# Patient Record
Sex: Female | Born: 1985 | Race: White | Hispanic: No | Smoking: Never smoker
Health system: Southern US, Community
[De-identification: ages and names within clinical notes are randomized; demographics above are authoritative.]

## PROBLEM LIST (undated history)

## (undated) HISTORY — PX: MOLE REMOVAL: SHX2046

---

## 2010-05-15 ENCOUNTER — Emergency Department (HOSPITAL_COMMUNITY): Admission: EM | Admit: 2010-05-15 | Discharge: 2010-05-15 | Payer: Self-pay | Admitting: Emergency Medicine

## 2010-10-13 LAB — CBC
Hemoglobin: 13.7 g/dL (ref 12.0–15.0)
MCHC: 34.5 g/dL (ref 30.0–36.0)
Platelets: 239 10*3/uL (ref 150–400)
RDW: 13.2 % (ref 11.5–15.5)

## 2010-10-13 LAB — URINALYSIS, ROUTINE W REFLEX MICROSCOPIC
Glucose, UA: NEGATIVE mg/dL
Hgb urine dipstick: NEGATIVE
Protein, ur: 30 mg/dL — AB

## 2010-10-13 LAB — URINE MICROSCOPIC-ADD ON

## 2010-10-13 LAB — POCT PREGNANCY, URINE: Preg Test, Ur: NEGATIVE

## 2010-10-13 LAB — CLOSTRIDIUM DIFFICILE EIA

## 2010-10-13 LAB — POCT I-STAT, CHEM 8
Chloride: 104 mEq/L (ref 96–112)
HCT: 43 % (ref 36.0–46.0)
Potassium: 4 mEq/L (ref 3.5–5.1)
Sodium: 139 mEq/L (ref 135–145)

## 2010-10-13 LAB — DIFFERENTIAL
Basophils Absolute: 0.1 10*3/uL (ref 0.0–0.1)
Basophils Relative: 1 % (ref 0–1)
Neutro Abs: 9.8 10*3/uL — ABNORMAL HIGH (ref 1.7–7.7)
Neutrophils Relative %: 82 % — ABNORMAL HIGH (ref 43–77)

## 2013-05-07 ENCOUNTER — Emergency Department (HOSPITAL_COMMUNITY): Payer: Worker's Compensation

## 2013-05-07 ENCOUNTER — Emergency Department (HOSPITAL_COMMUNITY)
Admission: EM | Admit: 2013-05-07 | Discharge: 2013-05-07 | Disposition: A | Discharge status: Home / Self Care | Payer: Worker's Compensation | Attending: Emergency Medicine | Admitting: Emergency Medicine

## 2013-05-07 ENCOUNTER — Encounter (HOSPITAL_COMMUNITY): Payer: Self-pay | Admitting: Emergency Medicine

## 2013-05-07 DIAGNOSIS — Y9289 Other specified places as the place of occurrence of the external cause: Secondary | ICD-10-CM | POA: Insufficient documentation

## 2013-05-07 DIAGNOSIS — W208XXA Other cause of strike by thrown, projected or falling object, initial encounter: Secondary | ICD-10-CM | POA: Insufficient documentation

## 2013-05-07 DIAGNOSIS — Z91040 Latex allergy status: Secondary | ICD-10-CM | POA: Insufficient documentation

## 2013-05-07 DIAGNOSIS — Y9389 Activity, other specified: Secondary | ICD-10-CM | POA: Insufficient documentation

## 2013-05-07 DIAGNOSIS — S9030XA Contusion of unspecified foot, initial encounter: Secondary | ICD-10-CM | POA: Insufficient documentation

## 2013-05-07 DIAGNOSIS — Y990 Civilian activity done for income or pay: Secondary | ICD-10-CM | POA: Insufficient documentation

## 2013-05-07 DIAGNOSIS — S9031XA Contusion of right foot, initial encounter: Secondary | ICD-10-CM

## 2013-05-07 DIAGNOSIS — Z792 Long term (current) use of antibiotics: Secondary | ICD-10-CM | POA: Insufficient documentation

## 2013-05-07 MED ORDER — HYDROCODONE-ACETAMINOPHEN 5-325 MG PO TABS: 1.0000 | ORAL_TABLET | ORAL | Status: DC | PRN

## 2013-05-07 MED ORDER — IBUPROFEN 600 MG PO TABS: 600.0000 mg | ORAL_TABLET | Freq: Four times a day (QID) | ORAL | Status: DC | PRN

## 2013-05-07 MED ORDER — IBUPROFEN 400 MG PO TABS
800.0000 mg | ORAL_TABLET | Freq: Once | ORAL | Status: AC
  Administered 2013-05-07: 800 mg via ORAL
  Filled 2013-05-07: qty 2

## 2013-05-07 NOTE — ED Notes (Signed)
Pt. reports injury / pain at right foot while at work [facility] [employer] ) today , a pack of wooden spindle fell on her right foot this afternoon , pain worse with movement / slight swelling.

## 2013-05-07 NOTE — ED Notes (Signed)
Pt dc to home. Pt sts understanding to dc instructions. Pt ambulatory to exit without difficulty.  Pt denies need for w/c.  

## 2013-05-07 NOTE — ED Provider Notes (Signed)
CSN: [id number]     Arrival date & time 05/07/13  1934 History  This chart was scribed for non-physician practitioner Marlon Pel, PA-C working with Juliet Rude. Rubin Payor, MD by Danella Maiers, ED Scribe. This patient was seen in room TR05C/TR05C and the patient's care was started at 9:33 PM.   Chief Complaint  Patient presents with  . Foot Injury   The history is provided by the patient. No language interpreter was used.   HPI Comments: Shannon York is a 27 y.o. female who presents to the Emergency Department complaining of constant right foot pain after a pack of wooden spindle fell on her right foot at 5:30pm today while working at [employer]. She reports associated mild swelling. She reports the pain is worsened by movement. She is able to walk with pain. She has not tried any OTC medications. She denies numbness.   History reviewed. No pertinent past medical history. History reviewed. No pertinent past surgical history. No family history on file. History  Substance Use Topics  . Smoking status: Never Smoker   . Smokeless tobacco: Not on file  . Alcohol Use: No   OB History   Grav Para Term Preterm Abortions TAB SAB Ect Mult Living                 Review of Systems  Constitutional: Negative for fever, diaphoresis, appetite change, fatigue and unexpected weight change.  HENT: Negative for mouth sores and neck stiffness.   Eyes: Negative for visual disturbance.  Respiratory: Negative for cough, chest tightness, shortness of breath and wheezing.   Cardiovascular: Negative for chest pain.  Gastrointestinal: Negative for nausea, vomiting, abdominal pain, diarrhea and constipation.  Endocrine: Negative for polydipsia, polyphagia and polyuria.  Genitourinary: Negative for dysuria, urgency, frequency and hematuria.  Musculoskeletal: Negative for back pain.  Skin: Negative for rash.  Allergic/Immunologic: Negative for immunocompromised state.  Neurological: Negative for syncope,  light-headedness and headaches.  Hematological: Does not bruise/bleed easily.  Psychiatric/Behavioral: Negative for sleep disturbance. The patient is not nervous/anxious.     Allergies  Latex  Home Medications   Current Outpatient Rx  Name  Route  Sig  Dispense  Refill  . ciprofloxacin (CIPRO) 500 MG tablet   Oral   Take 500 mg by mouth 2 (two) times daily. 7 day course starting 05/07/13 pm         . citalopram (CELEXA) 10 MG tablet   Oral   Take 10 mg by mouth at bedtime.         . fluconazole (DIFLUCAN) 150 MG tablet   Oral   Take 150 mg by mouth See admin instructions. Take one tablet by mouth with 1st dose of cipro, then take a 2nd pill at the end of the course of cipro          BP 123/75  Pulse 82  Temp(Src) 97.5 F (36.4 C) (Oral)  Resp 18  SpO2 96%  LMP 04/15/2013 Physical Exam  Nursing note and vitals reviewed. Constitutional: She is oriented to person, place, and time. She appears well-developed and well-nourished. No distress.  HENT:  Head: Normocephalic and atraumatic.  Eyes: EOM are normal.  Neck: Neck supple. No tracheal deviation present.  Cardiovascular: Normal rate.   Pulmonary/Chest: Effort normal. No respiratory distress.  Musculoskeletal: Normal range of motion.       Right foot: She exhibits tenderness, bony tenderness and swelling. She exhibits normal range of motion, normal capillary refill, no crepitus, no deformity and no laceration.  Feet:  Neurological: She is alert and oriented to person, place, and time.  Skin: Skin is warm and dry.  Psychiatric: She has a normal mood and affect. Her behavior is normal.    ED Course  Procedures (including critical care time) Medications  ibuprofen (ADVIL,MOTRIN) tablet 800 mg (not administered)    DIAGNOSTIC STUDIES: Oxygen Saturation is 96% on RA, normal by my interpretation.    COORDINATION OF CARE: 10:04 PM- Discussed treatment plan with pt which includes Ace wrap and discharge home  with  A few pills of Vicodin but encouraged to take only if needed and take Ibuprofen regularly for pain and pt agrees to plan. F./u to ortho given    Labs Review Labs Reviewed - No data to display Imaging Review Dg Foot Complete Right  05/07/2013   CLINICAL DATA:  Pain after trauma.  EXAM: RIGHT FOOT COMPLETE - 3+ VIEW  COMPARISON:  None.  FINDINGS: There is no evidence of fracture or dislocation. There is no evidence of arthropathy or other focal bone abnormality. Soft tissues are unremarkable.  IMPRESSION: Negative.   Electronically Signed   By: Elberta Fortis M.D.   On: 05/07/2013 20:31    MDM   1. Foot contusion, right, initial encounter    27 y.o.Muranda Holford's evaluation in the Emergency Department is complete. It has been determined that no acute conditions requiring further emergency intervention are present at this time. The patient/guardian have been advised of the diagnosis and plan. We have discussed signs and symptoms that warrant return to the ED, such as changes or worsening in symptoms.  Vital signs are stable at discharge. Filed Vitals:   05/07/13 2053  BP: 123/75  Pulse: 82  Temp: 97.5 F (36.4 C)  Resp: 18    Patient/guardian has voiced understanding and agreed to follow-up with the PCP or specialist.  I personally performed the services described in this documentation, which was scribed in my presence. The recorded information has been reviewed and is accurate.    Dorthula Matas, PA-C 05/07/13 2213

## 2013-05-10 NOTE — ED Provider Notes (Signed)
Medical screening examination/treatment/procedure(s) were performed by non-physician practitioner and as supervising physician I was immediately available for consultation/collaboration.  Aslan Montagna R. Amulya Quintin, MD 05/10/13 1017 

## 2013-06-25 ENCOUNTER — Ambulatory Visit (INDEPENDENT_AMBULATORY_CARE_PROVIDER_SITE_OTHER): Payer: BC Managed Care – PPO

## 2013-06-25 ENCOUNTER — Ambulatory Visit (INDEPENDENT_AMBULATORY_CARE_PROVIDER_SITE_OTHER): Payer: BC Managed Care – PPO | Admitting: Podiatry

## 2013-06-25 ENCOUNTER — Encounter: Payer: Self-pay | Admitting: Podiatry

## 2013-06-25 VITALS — BP 114/49 | HR 87 | Resp 16 | Ht 66.0 in | Wt 186.2 lb

## 2013-06-25 DIAGNOSIS — M722 Plantar fascial fibromatosis: Secondary | ICD-10-CM

## 2013-06-25 DIAGNOSIS — M79609 Pain in unspecified limb: Secondary | ICD-10-CM

## 2013-06-25 DIAGNOSIS — M79671 Pain in right foot: Secondary | ICD-10-CM

## 2013-06-25 DIAGNOSIS — S9031XA Contusion of right foot, initial encounter: Secondary | ICD-10-CM

## 2013-06-25 DIAGNOSIS — S9030XA Contusion of unspecified foot, initial encounter: Secondary | ICD-10-CM

## 2013-06-25 MED ORDER — METHYLPREDNISOLONE (PAK) 4 MG PO TABS: ORAL_TABLET | ORAL | Status: DC

## 2013-06-25 MED ORDER — MELOXICAM 15 MG PO TABS: 15.0000 mg | ORAL_TABLET | Freq: Every day | ORAL | Status: DC

## 2013-06-25 NOTE — Progress Notes (Signed)
"  I hurt my foot at work and its still bothering me"  N - aches L - forefoot and dorsal right D - 5 mos O - gradual C - sensitive to touch, hasn't noticed any swelling, injury at work, worse, has heard and felt several "pops" while walking A - walking, shoes, squatting T - ER - xrayed, Rx'd 2 meds, never filled to costly, went to workers [employer], gave Naproxen-doesn't help, ice at home

## 2013-06-25 NOTE — Patient Instructions (Signed)
Plantar Fasciitis (Heel Spur Syndrome) with Rehab The plantar fascia is a fibrous, ligament-like, soft-tissue structure that spans the bottom of the foot. Plantar fasciitis is a condition that causes pain in the foot due to inflammation of the tissue. SYMPTOMS   Pain and tenderness on the underneath side of the foot.  Pain that worsens with standing or walking. CAUSES  Plantar fasciitis is caused by irritation and injury to the plantar fascia on the underneath side of the foot. Common mechanisms of injury include:  Direct trauma to bottom of the foot.  Damage to a small nerve that runs under the foot where the main fascia attaches to the heel bone.  Stress placed on the plantar fascia due to bone spurs. RISK INCREASES WITH:   Activities that place stress on the plantar fascia (running, jumping, pivoting, or cutting).  Poor strength and flexibility.  Improperly fitted shoes.  Tight calf muscles.  Flat feet.  Failure to warm-up properly before activity.  Obesity. PREVENTION  Warm up and stretch properly before activity.  Allow for adequate recovery between workouts.  Maintain physical fitness:  Strength, flexibility, and endurance.  Cardiovascular fitness.  Maintain a health body weight.  Avoid stress on the plantar fascia.  Wear properly fitted shoes, including arch supports for individuals who have flat feet. PROGNOSIS  If treated properly, then the symptoms of plantar fasciitis usually resolve without surgery. However, occasionally surgery is necessary. RELATED COMPLICATIONS   Recurrent symptoms that may result in a chronic condition.  Problems of the lower back that are caused by compensating for the injury, such as limping.  Pain or weakness of the foot during push-off following surgery.  Chronic inflammation, scarring, and partial or complete fascia tear, occurring more often from repeated injections. TREATMENT  Treatment initially involves the use of  ice and medication to help reduce pain and inflammation. The use of strengthening and stretching exercises may help reduce pain with activity, especially stretches of the Achilles tendon. These exercises may be performed at home or with a therapist. Your caregiver may recommend that you use heel cups of arch supports to help reduce stress on the plantar fascia. Occasionally, corticosteroid injections are given to reduce inflammation. If symptoms persist for greater than 6 months despite non-surgical (conservative), then surgery may be recommended.  MEDICATION   If pain medication is necessary, then nonsteroidal anti-inflammatory medications, such as aspirin and ibuprofen, or other minor pain relievers, such as acetaminophen, are often recommended.  Do not take pain medication within 7 days before surgery.  Prescription pain relievers may be given if deemed necessary by your caregiver. Use only as directed and only as much as you need.  Corticosteroid injections may be given by your caregiver. These injections should be reserved for the most serious cases, because they may only be given a certain number of times. HEAT AND COLD  Cold treatment (icing) relieves pain and reduces inflammation. Cold treatment should be applied for 10 to 15 minutes every 2 to 3 hours for inflammation and pain and immediately after any activity that aggravates your symptoms. Use ice packs or massage the area with a piece of ice (ice massage).  Heat treatment may be used prior to performing the stretching and strengthening activities prescribed by your caregiver, physical therapist, or athletic trainer. Use a heat pack or soak the injury in warm water. SEEK IMMEDIATE MEDICAL CARE IF:  Treatment seems to offer no benefit, or the condition worsens.  Any medications produce adverse side effects. EXERCISES RANGE   OF MOTION (ROM) AND STRETCHING EXERCISES - Plantar Fasciitis (Heel Spur Syndrome) These exercises may help you  when beginning to rehabilitate your injury. Your symptoms may resolve with or without further involvement from your physician, physical therapist or athletic trainer. While completing these exercises, remember:   Restoring tissue flexibility helps normal motion to return to the joints. This allows healthier, less painful movement and activity.  An effective stretch should be held for at least 30 seconds.  A stretch should never be painful. You should only feel a gentle lengthening or release in the stretched tissue. RANGE OF MOTION - Toe Extension, Flexion  Sit with your right / left leg crossed over your opposite knee.  Grasp your toes and gently pull them back toward the top of your foot. You should feel a stretch on the bottom of your toes and/or foot.  Hold this stretch for __________ seconds.  Now, gently pull your toes toward the bottom of your foot. You should feel a stretch on the top of your toes and or foot.  Hold this stretch for __________ seconds. Repeat __________ times. Complete this stretch __________ times per day.  RANGE OF MOTION - Ankle Dorsiflexion, Active Assisted  Remove shoes and sit on a chair that is preferably not on a carpeted surface.  Place right / left foot under knee. Extend your opposite leg for support.  Keeping your heel down, slide your right / left foot back toward the chair until you feel a stretch at your ankle or calf. If you do not feel a stretch, slide your bottom forward to the edge of the chair, while still keeping your heel down.  Hold this stretch for __________ seconds. Repeat __________ times. Complete this stretch __________ times per day.  STRETCH  Gastroc, Standing  Place hands on wall.  Extend right / left leg, keeping the front knee somewhat bent.  Slightly point your toes inward on your back foot.  Keeping your right / left heel on the floor and your knee straight, shift your weight toward the wall, not allowing your back to  arch.  You should feel a gentle stretch in the right / left calf. Hold this position for __________ seconds. Repeat __________ times. Complete this stretch __________ times per day. STRETCH  Soleus, Standing  Place hands on wall.  Extend right / left leg, keeping the other knee somewhat bent.  Slightly point your toes inward on your back foot.  Keep your right / left heel on the floor, bend your back knee, and slightly shift your weight over the back leg so that you feel a gentle stretch deep in your back calf.  Hold this position for __________ seconds. Repeat __________ times. Complete this stretch __________ times per day. STRETCH  Gastrocsoleus, Standing  Note: This exercise can place a lot of stress on your foot and ankle. Please complete this exercise only if specifically instructed by your caregiver.   Place the ball of your right / left foot on a step, keeping your other foot firmly on the same step.  Hold on to the wall or a rail for balance.  Slowly lift your other foot, allowing your body weight to press your heel down over the edge of the step.  You should feel a stretch in your right / left calf.  Hold this position for __________ seconds.  Repeat this exercise with a slight bend in your right / left knee. Repeat __________ times. Complete this stretch __________ times per day.    STRENGTHENING EXERCISES - Plantar Fasciitis (Heel Spur Syndrome)  These exercises may help you when beginning to rehabilitate your injury. They may resolve your symptoms with or without further involvement from your physician, physical therapist or athletic trainer. While completing these exercises, remember:   Muscles can gain both the endurance and the strength needed for everyday activities through controlled exercises.  Complete these exercises as instructed by your physician, physical therapist or athletic trainer. Progress the resistance and repetitions only as guided. STRENGTH - Towel  Curls  Sit in a chair positioned on a non-carpeted surface.  Place your foot on a towel, keeping your heel on the floor.  Pull the towel toward your heel by only curling your toes. Keep your heel on the floor.  If instructed by your physician, physical therapist or athletic trainer, add ____________________ at the end of the towel. Repeat __________ times. Complete this exercise __________ times per day. STRENGTH - Ankle Inversion  Secure one end of a rubber exercise band/tubing to a fixed object (table, pole). Loop the other end around your foot just before your toes.  Place your fists between your knees. This will focus your strengthening at your ankle.  Slowly, pull your big toe up and in, making sure the band/tubing is positioned to resist the entire motion.  Hold this position for __________ seconds.  Have your muscles resist the band/tubing as it slowly pulls your foot back to the starting position. Repeat __________ times. Complete this exercises __________ times per day.  Document Released: 07/18/2005 Document Revised: 10/10/2011 Document Reviewed: 10/30/2008 ExitCare Patient Information 2014 [employer]. Plantar Fasciitis Plantar fasciitis is a common condition that causes foot pain. It is soreness (inflammation) of the band of tough fibrous tissue on the bottom of the foot that runs from the heel bone (calcaneus) to the ball of the foot. The cause of this soreness may be from excessive standing, poor fitting shoes, running on hard surfaces, being overweight, having an abnormal walk, or overuse (this is common in runners) of the painful foot or feet. It is also common in aerobic exercise dancers and ballet dancers. SYMPTOMS  Most people with plantar fasciitis complain of:  Severe pain in the morning on the bottom of their foot especially when taking the first steps out of bed. This pain recedes after a few minutes of walking.  Severe pain is experienced also during walking  following a long period of inactivity.  Pain is worse when walking barefoot or up stairs DIAGNOSIS   Your caregiver will diagnose this condition by examining and feeling your foot.  Special tests such as X-rays of your foot, are usually not needed. PREVENTION   Consult a sports medicine professional before beginning a new exercise program.  Walking programs offer a good workout. With walking there is a lower chance of overuse injuries common to runners. There is less impact and less jarring of the joints.  Begin all new exercise programs slowly. If problems or pain develop, decrease the amount of time or distance until you are at a comfortable level.  Wear good shoes and replace them regularly.  Stretch your foot and the heel cords at the back of the ankle (Achilles tendon) both before and after exercise.  Run or exercise on even surfaces that are not hard. For example, asphalt is better than pavement.  Do not run barefoot on hard surfaces.  If using a treadmill, vary the incline.  Do not continue to workout if you have foot or joint   problems. Seek professional help if they do not improve. HOME CARE INSTRUCTIONS   Avoid activities that cause you pain until you recover.  Use ice or cold packs on the problem or painful areas after working out.  Only take over-the-counter or prescription medicines for pain, discomfort, or fever as directed by your caregiver.  Soft shoe inserts or athletic shoes with air or gel sole cushions may be helpful.  If problems continue or become more severe, consult a sports medicine caregiver or your own health care provider. Cortisone is a potent anti-inflammatory medication that may be injected into the painful area. You can discuss this treatment with your caregiver. MAKE SURE YOU:   Understand these instructions.  Will watch your condition.  Will get help right away if you are not doing well or get worse. Document Released: 04/12/2001 Document  Revised: 10/10/2011 Document Reviewed: 06/11/2008 Exit[employer] Patient Information 2014 [employer].  

## 2013-06-25 NOTE — Progress Notes (Signed)
Shannon York presents today as a 27 year old white female with a chief complaint of painful foot right. She states that her right foot have been hurting prior to October 7 before it was injured while at work. She states that October 7 of 2014 a 12 pack of ballisters fell in her right foot. She was seen by Dr. today for pain medicine and said that they noted no fractures. 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 Over the last month her foot has gotten no better she has pain in the morning to the right heel to the lateral aspect of the right foot the dorsal aspect of the right foot and the forefoot. Pain is bad in the morning and seems to worsen as the day goes on. She has pain after she sits stand back up to ambulate.  Objective: I have reviewed her past medical history medications and allergies. Vital signs are stable she is alert and oriented x3. Pulses are strongly palpable bilateral foot. Neurologic sensorium is intact per since once the monofilament. Deep tendon reflexes are intact and brisk bilateral. Muscle strength is 5 over 5 dorsiflexors plantar flexors inverters and evertors are all intact. Orthopedic evaluation demonstrates pain on palpation medial calcaneal tubercle of the right heel. No pain on medial lateral compression of the calcaneus. She has tenderness on palpation of the lateral aspect of the foot along the fourth fifth met cuboid articulation across the dorsum of the foot where she said the injury took place. Radiographic evaluation does not demonstrate any type of osseous abnormalities to the dorsum of the foot or so lateral aspect of the foot or the forefoot. However it does demonstrate soft tissue increase in density at the plantar fascial calcaneal insertion site of the right heel. This is indicative of plantar fasciitis.  Assessment: Contusion right foot secondary to injury while at work. Additionally plantar fasciitis with lateral compensatory syndrome is present.  Plan: We discussed the etiology  pathology conservative versus surgical therapies. At this point we injected the right heel, put her in a plantar fascial strapping. Dispensed a night splint. Discussed appropriate shoe gear stretching exercises and ice therapy. Handouts given on how to perform these tasks. We dispensed a prescription for Medrol Dosepak to be followed by [facility] I will followup with her in one month.

## 2013-07-10 ENCOUNTER — Telehealth: Payer: Self-pay | Admitting: *Deleted

## 2013-07-10 ENCOUNTER — Encounter: Payer: Self-pay | Admitting: *Deleted

## 2013-07-10 NOTE — Telephone Encounter (Signed)
Pt request note for light duty at work.  See note dated 07/10/2013.

## 2013-07-23 ENCOUNTER — Encounter: Payer: Self-pay | Admitting: Podiatry

## 2013-07-23 ENCOUNTER — Ambulatory Visit (INDEPENDENT_AMBULATORY_CARE_PROVIDER_SITE_OTHER): Payer: BC Managed Care – PPO | Admitting: Podiatry

## 2013-07-23 VITALS — BP 115/64 | HR 70 | Resp 16

## 2013-07-23 DIAGNOSIS — M722 Plantar fascial fibromatosis: Secondary | ICD-10-CM

## 2013-07-23 DIAGNOSIS — S9031XD Contusion of right foot, subsequent encounter: Secondary | ICD-10-CM

## 2013-07-23 DIAGNOSIS — Z5189 Encounter for other specified aftercare: Secondary | ICD-10-CM

## 2013-07-23 NOTE — Progress Notes (Signed)
She presents today for followup of her plantar fasciitis right she states the ball of my foot has become tender now. The plantar fascia is still painful.  Objective: vital signs are stable she is alert and oriented x3. She has pain on palpation medial continued tubercle of the right heel. Some tenderness on palpation of the medial and plantar medial aspect of the first metatarsophalangeal joint right.  Assessment: Plantar fasciitis right with compensatory changes first metatarsophalangeal joint right.  Plan: Continue anti-inflammatories reinjected the right heel today consider orthotics the next him she's in.

## 2013-08-20 ENCOUNTER — Encounter: Payer: Self-pay | Admitting: Podiatry

## 2013-08-20 ENCOUNTER — Ambulatory Visit (INDEPENDENT_AMBULATORY_CARE_PROVIDER_SITE_OTHER): Payer: BC Managed Care – PPO | Admitting: Podiatry

## 2013-08-20 VITALS — BP 117/90 | HR 74 | Resp 12

## 2013-08-20 DIAGNOSIS — M722 Plantar fascial fibromatosis: Secondary | ICD-10-CM

## 2013-08-20 NOTE — Progress Notes (Signed)
   Subjective:    Patient ID: Shannon York, female    DOB: 05/11/1986, 28 y.o.   MRN: [id number]  HPI Comments: '' RT FOOT STILL PAINFUL.''     Review of Systems     Objective:   Physical Exam: I reviewed her past medical history medications allergies surgical history. She has pain on palpation medial continued tubercle of the right heel. This does appear to be improving however the sustainability of it is questionable. I believe orthotics will be best in the long run.        Assessment & Plan:  Assessment: Chronic intractable plantar fasciitis right.  Plan: Discussed etiology pathology conservative versus surgical therapies. At this point we scan her for orthotics today I will followup with her in one month once those come in. I suggested that she continue all conservative therapies.

## 2013-10-25 ENCOUNTER — Other Ambulatory Visit: Payer: BC Managed Care – PPO

## 2013-10-25 ENCOUNTER — Telehealth: Payer: Self-pay | Admitting: *Deleted

## 2013-10-25 NOTE — Telephone Encounter (Signed)
I need to pick up my orthotics.  I will see Dr. Al CorpusHyatt at a later time.  Patient stated that she has scheduled an appointment for 11/08/13.

## 2013-11-08 ENCOUNTER — Ambulatory Visit (INDEPENDENT_AMBULATORY_CARE_PROVIDER_SITE_OTHER): Payer: BC Managed Care – PPO | Admitting: *Deleted

## 2013-11-08 DIAGNOSIS — M722 Plantar fascial fibromatosis: Secondary | ICD-10-CM

## 2013-11-08 NOTE — Progress Notes (Signed)
Dispensed patient's orthotics with oral and written instructions for wearing. Patient will follow up with Dr. Hyatt in 1 month for an orthotic check. 

## 2013-11-08 NOTE — Patient Instructions (Signed)

## 2013-12-05 ENCOUNTER — Ambulatory Visit (INDEPENDENT_AMBULATORY_CARE_PROVIDER_SITE_OTHER): Payer: BC Managed Care – PPO | Admitting: Podiatry

## 2013-12-05 ENCOUNTER — Encounter: Payer: Self-pay | Admitting: Podiatry

## 2013-12-05 VITALS — BP 118/68 | HR 75 | Resp 16

## 2013-12-05 DIAGNOSIS — M722 Plantar fascial fibromatosis: Secondary | ICD-10-CM

## 2013-12-05 MED ORDER — DICLOFENAC SODIUM 75 MG PO TBEC: 75.0000 mg | DELAYED_RELEASE_TABLET | Freq: Two times a day (BID) | ORAL | Status: DC

## 2013-12-06 NOTE — Progress Notes (Signed)
She presents today one month after picking up for orthotics. She states that they are or more painful now living were previously should things the orthotics may be resulting in some of the pain. Her left foot is starting to hurt.  Objective: Vital signs are stable she is alert and oriented x3 pulses are palpable bilateral. She has pain on palpation medial calcaneal tubercle of the right heel though much decrease from previous visits. She also has pain on palpation medial calcaneal tubercle of the left heel today which is new and is mildly painful.  Assessment: Under fasciitis bilateral.  Plan: Injected the left heel today with Kenalog and local anesthetic she declined injection to the right foot. She will continue to wear her orthotics in good shoe gear and followup with her in 8 weeks.

## 2013-12-12 ENCOUNTER — Telehealth: Payer: Self-pay | Admitting: *Deleted

## 2013-12-12 NOTE — Telephone Encounter (Signed)
[employer] sent the Voltaren gel prior authorization form, but was encorrect.  The proper form for Voltaren 75mg  was requested, but [facility] which has a $4.00/30 days formulary.  I informed pt, to begin the medication immediately she should not wait for the [employer] to authorize, but pay $4.00 now to receive Voltaren 75mg .  Pt agreed.

## 2014-01-16 ENCOUNTER — Encounter: Payer: Self-pay | Admitting: Podiatry

## 2014-01-16 ENCOUNTER — Ambulatory Visit (INDEPENDENT_AMBULATORY_CARE_PROVIDER_SITE_OTHER): Payer: BC Managed Care – PPO | Admitting: Podiatry

## 2014-01-16 DIAGNOSIS — M7750 Other enthesopathy of unspecified foot: Secondary | ICD-10-CM

## 2014-01-16 DIAGNOSIS — M722 Plantar fascial fibromatosis: Secondary | ICD-10-CM

## 2014-01-16 NOTE — Progress Notes (Signed)
She presents today for followup of her plantar fasciitis. She states the orthotics seem to be bothering her more than helping her. I have 2 spots on the bottom of my feet that hurt. She's also complaining of ankle pain. She states ankle pain is with or without the orthotics.  Objective: Vital signs are stable she is alert and oriented x3. She has pain on palpation and range of motion of the ankle joint bilaterally there is no crepitation noted. Pulses are strongly palpable under aspect of the fifth metatarsal bilaterally results demonstrates a porokeratotic lesion.  Assessment: Capsulitis of the ankles bilaterally. Resolving plantar fasciitis bilateral. Porokeratosis sub-fifth metatarsal bilateral.  Plan: Debridement of all reactive hyperkeratosis. Injected bilaterally today with Kenalog and local anesthetic after sterile Betadine skin prep. I will followup with her in 6 weeks.

## 2014-02-27 ENCOUNTER — Ambulatory Visit (INDEPENDENT_AMBULATORY_CARE_PROVIDER_SITE_OTHER): Payer: BC Managed Care – PPO | Admitting: Podiatry

## 2014-02-27 ENCOUNTER — Encounter: Payer: Self-pay | Admitting: Podiatry

## 2014-02-27 VITALS — BP 110/63 | HR 67 | Resp 14 | Ht 66.0 in | Wt 204.0 lb

## 2014-02-27 DIAGNOSIS — M7750 Other enthesopathy of unspecified foot: Secondary | ICD-10-CM

## 2014-02-27 DIAGNOSIS — M722 Plantar fascial fibromatosis: Secondary | ICD-10-CM

## 2014-02-27 MED ORDER — NABUMETONE 750 MG PO TABS
750.0000 mg | ORAL_TABLET | Freq: Two times a day (BID) | ORAL | Status: DC
Start: 2014-02-27 — End: 2016-06-07

## 2014-02-27 NOTE — Progress Notes (Signed)
She presents today stating that she still has the same amount of low-grade ankle pain associated with plantar fasciitis. Stating that the shots did not help last time and she does not think that her diclofenac is helping at all.  Objective: Vital signs are stable she is alert and oriented x3. She has mild tenderness on palpation medial calcaneal tubercles bilaterally. No tenderness on palpation of the ankles.  Assessment: Plantar fasciitis with compensatory ankle pain bilateral.  Plan: Discussed etiology pathology conservative versus surgical therapies at this point we started her on Relafen and discontinued the diclofenac. I will followup with her in one month to 6 weeks.

## 2014-04-10 ENCOUNTER — Encounter: Payer: Self-pay | Admitting: Podiatry

## 2014-04-10 ENCOUNTER — Ambulatory Visit (INDEPENDENT_AMBULATORY_CARE_PROVIDER_SITE_OTHER): Payer: BC Managed Care – PPO | Admitting: Podiatry

## 2014-04-10 VITALS — BP 113/67 | HR 64 | Resp 12

## 2014-04-10 DIAGNOSIS — M7750 Other enthesopathy of unspecified foot: Secondary | ICD-10-CM

## 2014-04-10 DIAGNOSIS — M722 Plantar fascial fibromatosis: Secondary | ICD-10-CM

## 2014-04-10 NOTE — Progress Notes (Signed)
She presents today for flow followup visit regarding her plantar fasciitis and her associated ankle pain. She states that she was unable to continue to secondary gastric upset. States that his feet feel better occasionally but not for long periods of time.  Objective: She has pain on palpation medial continued tubercles bilateral pulses are palpable bilateral.  Assessment: Plantar fasciitis bilateral.  Plan: She left today with a prescription for physical therapy I will followup with her for surgical consult to physical therapy not resolve her symptoms. Followup with her in one month

## 2015-05-29 IMAGING — CR DG FOOT COMPLETE 3+V*R*
3 series · 3 of 3 positions shown · non-contrast
Comparison: None.

CLINICAL DATA: Pain after trauma.

EXAM:
RIGHT FOOT COMPLETE - 3+ VIEW

[t foot ap right]
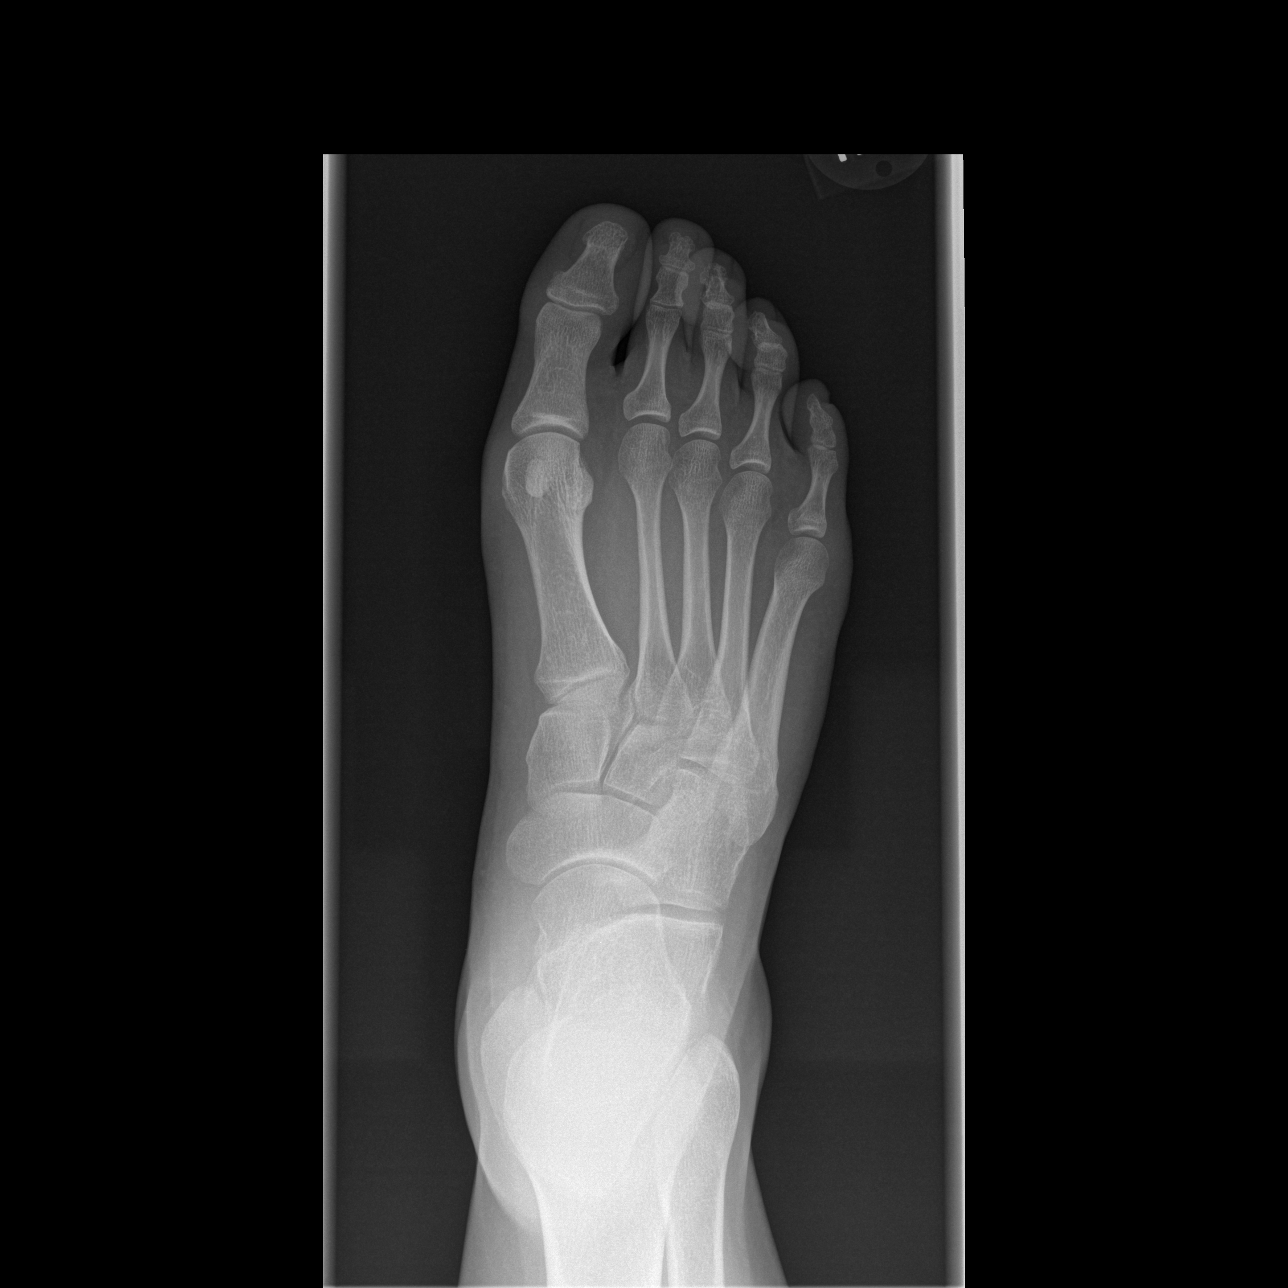

[t foot oblique right]
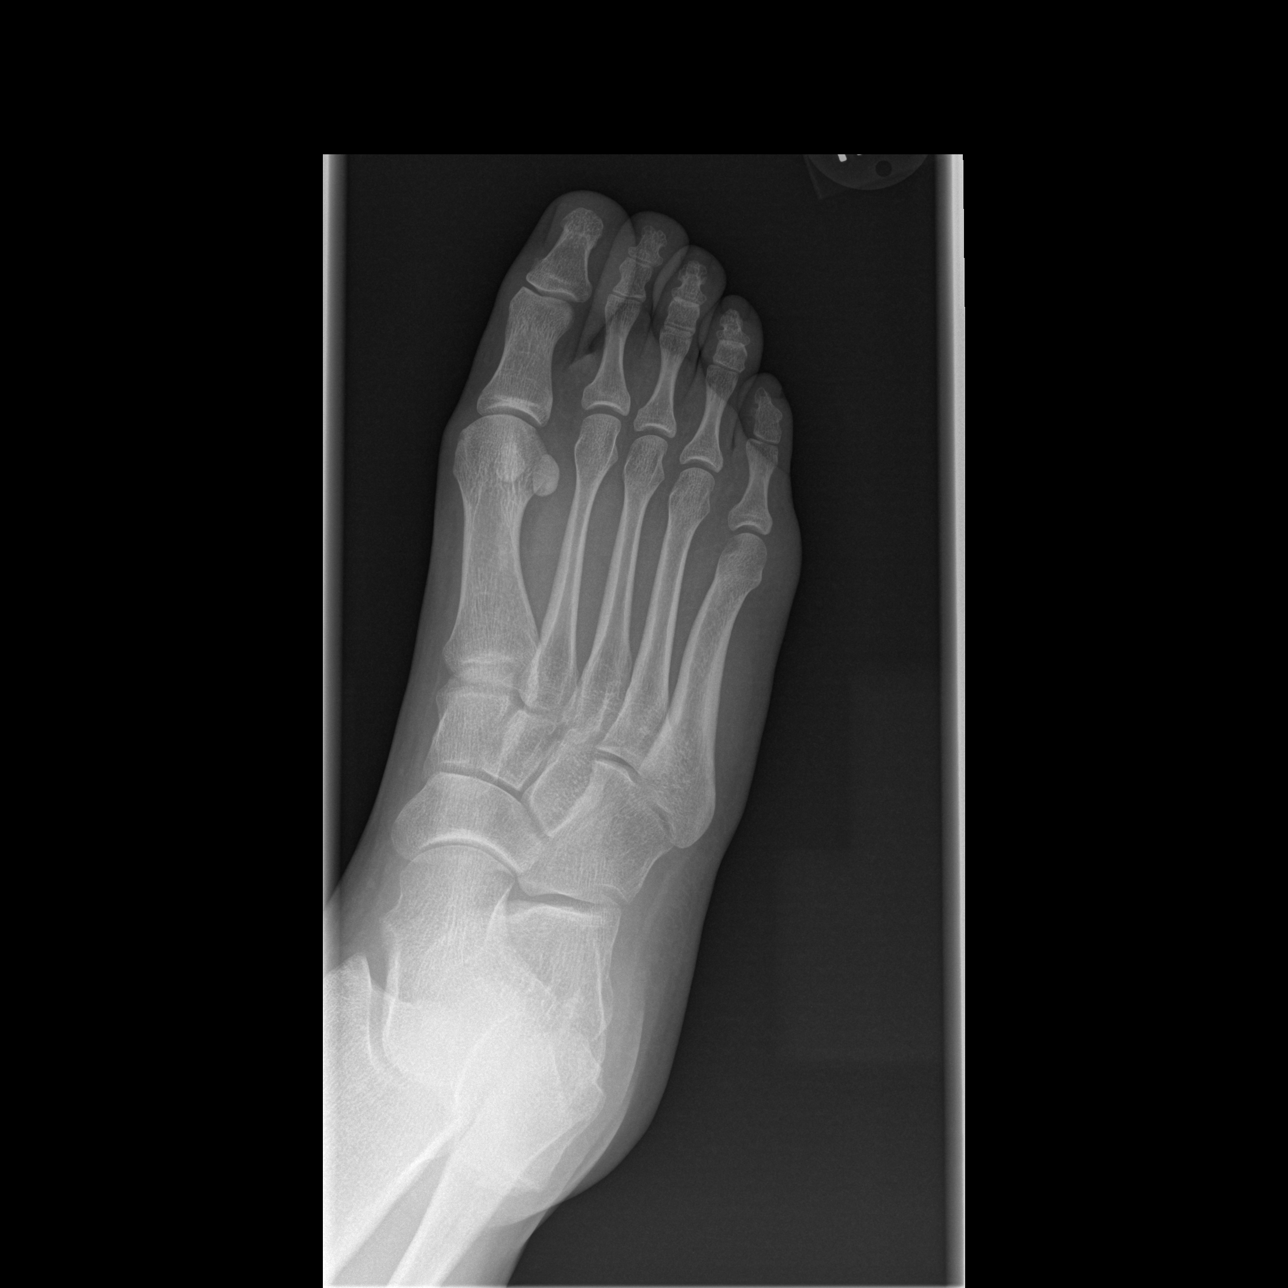

[t foot lat right]
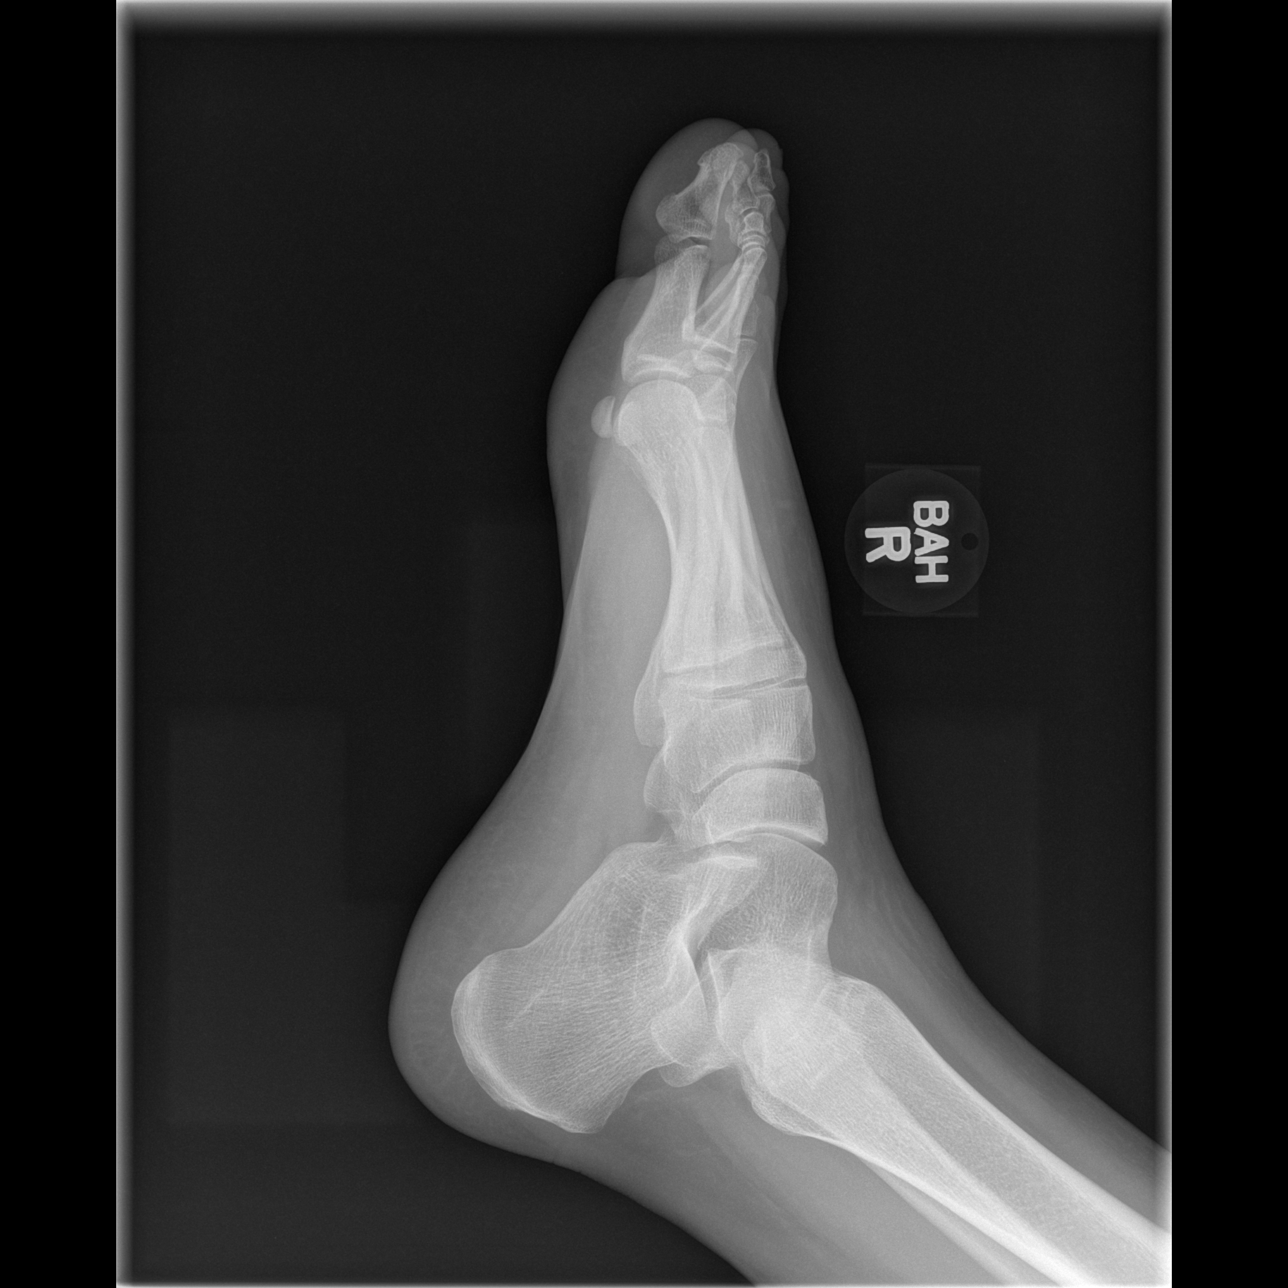

[3 of 3 positions shown; findings below may reference images not displayed]

FINDINGS: There is no evidence of fracture or dislocation. There is no
evidence of arthropathy or other focal bone abnormality. Soft
tissues are unremarkable.
IMPRESSION: Negative.

## 2016-06-07 ENCOUNTER — Encounter: Payer: Self-pay | Admitting: Podiatry

## 2016-06-07 ENCOUNTER — Ambulatory Visit (INDEPENDENT_AMBULATORY_CARE_PROVIDER_SITE_OTHER): Payer: BLUE CROSS/BLUE SHIELD | Admitting: Podiatry

## 2016-06-07 VITALS — BP 129/83 | HR 80 | Resp 16

## 2016-06-07 DIAGNOSIS — Q828 Other specified congenital malformations of skin: Secondary | ICD-10-CM

## 2016-06-07 DIAGNOSIS — L600 Ingrowing nail: Secondary | ICD-10-CM | POA: Diagnosis not present

## 2016-06-07 MED ORDER — NEOMYCIN-POLYMYXIN-HC 3.5-10000-1 OT SOLN: OTIC | 0 refills | Status: AC

## 2016-06-07 NOTE — Patient Instructions (Signed)

## 2016-06-08 NOTE — Progress Notes (Signed)
She presents today with chief complaint of an ingrown toenail to the tibial border of the second digit right foot. She states the splint on and off for quite some time and more painful as time goes on. 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 Erythema and edema and no purulence no malodor just tenderness on palpation. Multiple porokeratotic lesions in diffuse tyloma's bilateral plantar foot no lesions or wounds are noted. No signs of sepsis or cellulitis or ulcerations.  Assessment: Ingrown nail secondary to right foot tibial border. Porokeratosis tyloma's bilateral  Plan: Chemical matrixectomy was performed to the tibial border of the second digit of the right foot after local anesthesia was administered. She tolerated this procedure well. She is provided with both oral and going instructions for care and soaking of her toe. She is also provided with a prescription for Cortisporin Otic to be applied twice daily after set. We also debrided her reactive hyperkeratosis bilaterally.

## 2016-06-21 ENCOUNTER — Encounter: Payer: Self-pay | Admitting: Podiatry

## 2016-06-21 ENCOUNTER — Ambulatory Visit (INDEPENDENT_AMBULATORY_CARE_PROVIDER_SITE_OTHER): Payer: BLUE CROSS/BLUE SHIELD | Admitting: Podiatry

## 2016-06-21 DIAGNOSIS — L600 Ingrowing nail: Secondary | ICD-10-CM

## 2016-06-21 NOTE — Progress Notes (Signed)
She presents today with a follow-up of matrixectomy tibial border second digit right foot. She states that she is no longer smoking because it formed a scab. She is no longer using her Cortisporin Otic because it formed a scab.  Objective: Vital signs are stable she is alert and oriented 3 nail border appears to be healing quite nicely. There is mild erythema and no cellulitis drainage or odor. Mildly tender on palpation.  Assessment: Well-healing matrixectomy slightly erythematous.  Plan: I instructed her to continue soaking Epsom salts and warm water as she had discontinued. I also recommended that she continue her Cortisporin Otic drops until completed. She is to soak until there is no redness no pain and no drainage. She will cover during the day and leave open at bedtime. She will call with questions or concerns.
